# Patient Record
Sex: Male | Born: 1990 | Race: Black or African American | Hispanic: No | Smoking: Former smoker
Health system: Southern US, Community
[De-identification: ages and names within clinical notes are randomized; demographics above are authoritative.]

---

## 2016-03-28 ENCOUNTER — Emergency Department (HOSPITAL_COMMUNITY)
Admission: EM | Admit: 2016-03-28 | Discharge: 2016-03-28 | Disposition: A | Discharge status: Home / Self Care | Payer: Self-pay | Attending: Emergency Medicine | Admitting: Emergency Medicine

## 2016-03-28 ENCOUNTER — Encounter (HOSPITAL_COMMUNITY): Payer: Self-pay | Admitting: Emergency Medicine

## 2016-03-28 DIAGNOSIS — K0889 Other specified disorders of teeth and supporting structures: Secondary | ICD-10-CM | POA: Insufficient documentation

## 2016-03-28 DIAGNOSIS — F17200 Nicotine dependence, unspecified, uncomplicated: Secondary | ICD-10-CM | POA: Insufficient documentation

## 2016-03-28 MED ORDER — AMOXICILLIN 500 MG PO CAPS: 500.0000 mg | ORAL_CAPSULE | Freq: Three times a day (TID) | ORAL | 0 refills | Status: DC

## 2016-03-28 MED ORDER — IBUPROFEN 800 MG PO TABS: 800.0000 mg | ORAL_TABLET | Freq: Three times a day (TID) | ORAL | 0 refills | Status: DC

## 2016-03-28 MED ORDER — AMOXICILLIN 500 MG PO CAPS
500.0000 mg | ORAL_CAPSULE | Freq: Once | ORAL | Status: AC
  Administered 2016-03-28: 500 mg via ORAL
  Filled 2016-03-28: qty 1

## 2016-03-28 MED ORDER — HYDROCODONE-ACETAMINOPHEN 5-325 MG PO TABS: 2.0000 | ORAL_TABLET | ORAL | 0 refills | Status: DC | PRN

## 2016-03-28 MED ORDER — HYDROCODONE-ACETAMINOPHEN 5-325 MG PO TABS
1.0000 | ORAL_TABLET | Freq: Once | ORAL | Status: AC
  Administered 2016-03-28: 1 via ORAL
  Filled 2016-03-28: qty 1

## 2016-03-28 NOTE — ED Triage Notes (Signed)
Pt sts upper dental pain x 4 days

## 2016-03-28 NOTE — Discharge Instructions (Signed)
The penicillin will temporarily improved the pain in your teeth. You should see a dentist as soon as possible to discuss an extraction.

## 2016-03-28 NOTE — ED Notes (Signed)
Ice provided for the pain

## 2016-03-28 NOTE — ED Provider Notes (Signed)
[facility] Provider Note   CSN: [id number] Arrival date & time: 03/28/16  1732     History   Chief Complaint Chief Complaint  Patient presents with  . Dental Pain    HPI Prophet Gary Wilcox is a 25 y.o. male. He presents complaining of right upper tooth pain. He states it is been going on "a while". At least several weeks. He has been a piece of his right upper molar that is missing. Acutely painful 4 days ago. Feels like it is swelling in his face and he presents for evaluation. No difficult breathing or swallowing. No neck pain or swelling. No trauma.  HPI  History reviewed. No pertinent past medical history.  There are no active problems to display for this patient.   History reviewed. No pertinent surgical history.     Home Medications    Prior to Admission medications   Medication Sig Start Date End Date Taking? Authorizing Provider  amoxicillin (AMOXIL) 500 MG capsule Take 1 capsule (500 mg total) by mouth 3 (three) times daily. 03/28/16   Rolland PorterMark Jalise Zawistowski, MD  HYDROcodone-acetaminophen (NORCO/VICODIN) 5-325 MG tablet Take 2 tablets by mouth every 4 (four) hours as needed. 03/28/16   Rolland PorterMark Carely Nappier, MD  ibuprofen (ADVIL,MOTRIN) 800 MG tablet Take 1 tablet (800 mg total) by mouth 3 (three) times daily. 03/28/16   Rolland PorterMark Mineola Duan, MD    Family History History reviewed. No pertinent family history.  Social History Social History  Substance Use Topics  . Smoking status: Current Every Day Smoker  . Smokeless tobacco: Never Used  . Alcohol use No     Allergies   Patient has no known allergies.   Review of Systems Review of Systems  Constitutional: Negative for appetite change, chills, diaphoresis, fatigue and fever.  HENT: Positive for dental problem and facial swelling. Negative for mouth sores, sore throat and trouble swallowing.   Eyes: Negative for visual disturbance.  Respiratory: Negative for cough, chest tightness, shortness of breath and wheezing.     Cardiovascular: Negative for chest pain.  Gastrointestinal: Negative for abdominal distention, abdominal pain, diarrhea, nausea and vomiting.  Endocrine: Negative for polydipsia, polyphagia and polyuria.  Genitourinary: Negative for dysuria, frequency and hematuria.  Musculoskeletal: Negative for gait problem.  Skin: Negative for color change, pallor and rash.  Neurological: Negative for dizziness, syncope, light-headedness and headaches.  Hematological: Does not bruise/bleed easily.  Psychiatric/Behavioral: Negative for behavioral problems and confusion.     Physical Exam Updated Vital Signs BP 167/83 (BP Location: Left Arm)   Pulse 82   Temp 98.4 F (36.9 C) (Oral)   Resp 18   SpO2 100%   Physical Exam  Constitutional: He is oriented to person, place, and time. He appears well-developed and well-nourished. No distress.  HENT:  Head: Normocephalic.  Mouth/Throat:    Eyes: Conjunctivae are normal. Pupils are equal, round, and reactive to light. No scleral icterus.  Neck: Normal range of motion. Neck supple. No thyromegaly present.  Cardiovascular: Normal rate and regular rhythm.  Exam reveals no gallop and no friction rub.   No murmur heard. Pulmonary/Chest: Effort normal and breath sounds normal. No respiratory distress. He has no wheezes. He has no rales.  Abdominal: Soft. Bowel sounds are normal. He exhibits no distension. There is no tenderness. There is no rebound.  Musculoskeletal: Normal range of motion.  Neurological: He is alert and oriented to person, place, and time.  Skin: Skin is warm and dry. No rash noted.  Psychiatric: He has a normal mood  and affect. His behavior is normal.     ED Treatments / Results  Labs (all labs ordered are listed, but only abnormal results are displayed) Labs Reviewed - No data to display  EKG  EKG Interpretation None       Radiology No results found.  Procedures Procedures (including critical care time)  Medications  Ordered in ED Medications  amoxicillin (AMOXIL) capsule 500 mg (not administered)  HYDROcodone-acetaminophen (NORCO/VICODIN) 5-325 MG per tablet 1 tablet (not administered)     Initial Impression / Assessment and Plan / ED Course  I have reviewed the triage vital signs and the nursing notes.  Pertinent labs & imaging results that were available during my care of the patient were reviewed by me and considered in my medical decision making (see chart for details).  Clinical Course     Toe pain and dental fracture. Probable periapical infection. No obvious external abscess. No space-occupying swelling. Plan is immediately dental follow-up. Penicillin per limited number of Vicodin. Ibuprofen.  Final Clinical Impressions(s) / ED Diagnoses   Final diagnoses:  Pain, dental    New Prescriptions New Prescriptions   AMOXICILLIN (AMOXIL) 500 MG CAPSULE    Take 1 capsule (500 mg total) by mouth 3 (three) times daily.   HYDROCODONE-ACETAMINOPHEN (NORCO/VICODIN) 5-325 MG TABLET    Take 2 tablets by mouth every 4 (four) hours as needed.   IBUPROFEN (ADVIL,MOTRIN) 800 MG TABLET    Take 1 tablet (800 mg total) by mouth 3 (three) times daily.     Rolland PorterMark Jeanne Terrance, MD 03/28/16 [phone]

## 2016-09-16 ENCOUNTER — Encounter (HOSPITAL_COMMUNITY): Payer: Self-pay

## 2016-09-16 DIAGNOSIS — F17200 Nicotine dependence, unspecified, uncomplicated: Secondary | ICD-10-CM | POA: Insufficient documentation

## 2016-09-16 DIAGNOSIS — K029 Dental caries, unspecified: Secondary | ICD-10-CM | POA: Insufficient documentation

## 2016-09-16 NOTE — ED Triage Notes (Signed)
Pt here for dental pain on lower left side of face onset yesterday no relief with tylenol, asa, bc

## 2016-09-17 ENCOUNTER — Emergency Department (HOSPITAL_COMMUNITY)
Admission: EM | Admit: 2016-09-17 | Discharge: 2016-09-17 | Disposition: A | Discharge status: Home / Self Care | Payer: Self-pay | Attending: Emergency Medicine | Admitting: Emergency Medicine

## 2016-09-17 DIAGNOSIS — K0889 Other specified disorders of teeth and supporting structures: Secondary | ICD-10-CM

## 2016-09-17 MED ORDER — HYDROCODONE-ACETAMINOPHEN 5-325 MG PO TABS: 1.0000 | ORAL_TABLET | ORAL | 0 refills | Status: DC | PRN

## 2016-09-17 MED ORDER — IBUPROFEN 400 MG PO TABS
600.0000 mg | ORAL_TABLET | Freq: Once | ORAL | Status: AC
  Administered 2016-09-17: 600 mg via ORAL
  Filled 2016-09-17: qty 1

## 2016-09-17 MED ORDER — PENICILLIN V POTASSIUM 250 MG PO TABS
500.0000 mg | ORAL_TABLET | Freq: Once | ORAL | Status: AC
  Administered 2016-09-17: 500 mg via ORAL
  Filled 2016-09-17: qty 2

## 2016-09-17 MED ORDER — PENICILLIN V POTASSIUM 500 MG PO TABS: 500.0000 mg | ORAL_TABLET | Freq: Two times a day (BID) | ORAL | 0 refills | Status: AC

## 2016-09-17 MED ORDER — NAPROXEN 500 MG PO TABS: 500.0000 mg | ORAL_TABLET | Freq: Two times a day (BID) | ORAL | 0 refills | Status: DC

## 2016-09-17 MED ORDER — OXYCODONE-ACETAMINOPHEN 5-325 MG PO TABS: 1.0000 | ORAL_TABLET | Freq: Once | ORAL | Status: DC

## 2016-09-17 NOTE — Discharge Instructions (Signed)
Take medications as prescribed. You may also take 600 mg ibuprofen 4 times daily as needed for pain relief. I recommend eating prior to taking ibuprofen to prevent gastrointestinal side effects. You may apply ice to affected area for 15-20 minutes 3-4 times daily to help with pain. °Follow-up with one of the dental clinics listed below for further management of your dental pain. °Return to the emergency department if symptoms worsen or new onset of fever, headache, neck stiffness, facial/neck swelling, unable to open jaw, unable to swallow resulting in drooling, difficulty breathing, drainage, unable to tolerate fluids.  ° °[employer] °[employer] °[facility] °[address] °[place], [place] [address] °Phone [phone] ° °[facility] [employer] [facility] [place], [place], exemplifies the Dental School?s vision to improve the health and quality of life of all [place] by creating leaders with a passion to care for the underserved and by leading the nation in community-based, service learning oral health education. ° °We are committed to offering comprehensive general dental services for adults, children and special needs patients in a safe, caring and professional setting. ° ° °Appointments: Our clinic is open Monday through Friday 8:00 a.m. until 5:00 p.m. The amount of time scheduled for an appointment depends on the patient?s specific needs. We ask that you keep your appointed time for care or provide 24-hour notice of all appointment changes. Parents or legal guardians must accompany minor children. °  °Payment for Services: Medicaid and other insurance plans are welcome. Payment for services is due when services are rendered and may be made by cash or credit card. If you have dental insurance, we will assist you with your claim submission. °   °Emergencies:   Emergency services will be provided Monday through Friday on a walk-in basis.  Please arrive early for emergency services. After hours emergency services will be provided for patients of record as required. °  °Services:  °Comprehensive General Dentistry °Children?s Dentistry °Oral Surgery - Extractions °Root Canals °Sealants and Tooth Colored Fillings °Crowns and Bridges °Dentures and Partial Dentures °Implant Services °Periodontal Services and Cleanings °Cosmetic Tooth Whitening °Digital Radiography °3-D/[facility]  °

## 2016-09-17 NOTE — ED Notes (Signed)
See providers notes

## 2016-09-17 NOTE — ED Provider Notes (Signed)
[facility] Provider Note   CSN: [id number] Arrival date & time: 09/16/16  2255     History   Chief Complaint Chief Complaint  Patient presents with  . Dental Injury    HPI Gary Wilcox is a 26 y.o. male.  HPI   Patient is a 26 year old male with no pertinent past medical history presents the ED with complaint of dental pain, onset yesterday. Patient reports noticing his left lower molar was chipped yesterday morning. He reports later in the day he became to have worsening pain to his tooth which he notes with worse with eating or swallowing. Reports taking Tylenol, aspirin and BC powder at home without relief. Denies fever, facial/neck swelling, drainage, trismus, drooling, difficulty swallowing, shortness of breath, vomiting. Patient denies having a dentist.  History reviewed. No pertinent past medical history.  There are no active problems to display for this patient.   History reviewed. No pertinent surgical history.     Home Medications    Prior to Admission medications   Medication Sig Start Date End Date Taking? Authorizing Provider  amoxicillin (AMOXIL) 500 MG capsule Take 1 capsule (500 mg total) by mouth 3 (three) times daily. 03/28/16   Rolland Porter, MD  HYDROcodone-acetaminophen (NORCO/VICODIN) 5-325 MG tablet Take 2 tablets by mouth every 4 (four) hours as needed. 03/28/16   Rolland Porter, MD  ibuprofen (ADVIL,MOTRIN) 800 MG tablet Take 1 tablet (800 mg total) by mouth 3 (three) times daily. 03/28/16   Rolland Porter, MD    Family History History reviewed. No pertinent family history.  Social History Social History  Substance Use Topics  . Smoking status: Current Every Day Smoker  . Smokeless tobacco: Never Used  . Alcohol use No     Allergies   Patient has no known allergies.   Review of Systems Review of Systems  Constitutional: Negative for fever.  HENT: Positive for dental problem. Negative for facial swelling.   Respiratory: Negative for  shortness of breath.      Physical Exam Updated Vital Signs BP (!) 153/99 (BP Location: Right Arm)   Pulse 66   Temp 98.7 F (37.1 C) (Oral)   Resp 16   SpO2 100%   Physical Exam  Constitutional: He is oriented to person, place, and time. He appears well-developed and well-nourished.  HENT:  Head: Normocephalic and atraumatic.  Mouth/Throat: Uvula is midline, oropharynx is clear and moist and mucous membranes are normal. No oral lesions. No trismus in the jaw. Abnormal dentition. Dental caries present. No dental abscesses or uvula swelling. No oropharyngeal exudate, posterior oropharyngeal edema, posterior oropharyngeal erythema or tonsillar abscesses. No tonsillar exudate.    No trismus, drooling, facial/neck swelling or stridor on exam. No muffled voice. Floor of mouth soft.    Poor dentition throughout with multiple dental caries present.   Eyes: Conjunctivae and EOM are normal. Right eye exhibits no discharge. Left eye exhibits no discharge. No scleral icterus.  Neck: Normal range of motion. Neck supple.  Cardiovascular: Normal rate and intact distal pulses.   Pulmonary/Chest: Effort normal.  Musculoskeletal: Normal range of motion.  Neurological: He is alert and oriented to person, place, and time.  Skin: Skin is warm and dry.  Nursing note and vitals reviewed.    ED Treatments / Results  Labs (all labs ordered are listed, but only abnormal results are displayed) Labs Reviewed - No data to display  EKG  EKG Interpretation None       Radiology No results found.  Procedures Procedures (  including critical care time)  Medications Ordered in ED Medications  oxyCODONE-acetaminophen (PERCOCET/ROXICET) 5-325 MG per tablet 1 tablet (not administered)  penicillin v potassium (VEETID) tablet 500 mg (not administered)     Initial Impression / Assessment and Plan / ED Course  I have reviewed the triage vital signs and the nursing notes.  Pertinent labs & imaging  results that were available during my care of the patient were reviewed by me and considered in my medical decision making (see chart for details).     Patient with toothache.  No gross abscess.  Exam unconcerning for Ludwig's angina or spread of infection.  Will treat with penicillin and pain medicine.  Urged patient to follow-up with dentist.     Final Clinical Impressions(s) / ED Diagnoses   Final diagnoses:  None    New Prescriptions New Prescriptions   No medications on file     Barrett Henle, PA-C 09/17/16 0044    Zadie Rhine, MD 09/18/16 [phone]

## 2018-05-06 ENCOUNTER — Emergency Department (HOSPITAL_COMMUNITY)
Admission: EM | Admit: 2018-05-06 | Discharge: 2018-05-06 | Disposition: A | Discharge status: Home / Self Care | Payer: Self-pay | Attending: Emergency Medicine | Admitting: Emergency Medicine

## 2018-05-06 ENCOUNTER — Encounter (HOSPITAL_COMMUNITY): Payer: Self-pay | Admitting: *Deleted

## 2018-05-06 ENCOUNTER — Other Ambulatory Visit: Payer: Self-pay

## 2018-05-06 DIAGNOSIS — Z79899 Other long term (current) drug therapy: Secondary | ICD-10-CM | POA: Insufficient documentation

## 2018-05-06 DIAGNOSIS — K0889 Other specified disorders of teeth and supporting structures: Secondary | ICD-10-CM | POA: Insufficient documentation

## 2018-05-06 DIAGNOSIS — F17200 Nicotine dependence, unspecified, uncomplicated: Secondary | ICD-10-CM | POA: Insufficient documentation

## 2018-05-06 MED ORDER — PENICILLIN V POTASSIUM 250 MG PO TABS
500.0000 mg | ORAL_TABLET | Freq: Once | ORAL | Status: AC
  Administered 2018-05-06: 500 mg via ORAL
  Filled 2018-05-06: qty 2

## 2018-05-06 MED ORDER — NAPROXEN 250 MG PO TABS
500.0000 mg | ORAL_TABLET | Freq: Once | ORAL | Status: AC
  Administered 2018-05-06: 500 mg via ORAL
  Filled 2018-05-06: qty 2

## 2018-05-06 MED ORDER — PENICILLIN V POTASSIUM 500 MG PO TABS: 500.0000 mg | ORAL_TABLET | Freq: Four times a day (QID) | ORAL | 0 refills | Status: DC

## 2018-05-06 MED ORDER — NAPROXEN 500 MG PO TABS: 500.0000 mg | ORAL_TABLET | Freq: Two times a day (BID) | ORAL | 0 refills | Status: DC

## 2018-05-06 NOTE — ED Provider Notes (Signed)
[facility]   CSN: [id number] Arrival date & time: 05/06/18  [id number]     History   Chief Complaint Chief Complaint  Patient presents with  . Dental Pain    HPI Gary Wilcox is a 27 y.o. male.  The history is provided by the patient and medical records.  Dental Pain       27 year old male here with left upper dental pain.  Reports he has had a broken tooth in this area for several months now but has not had any issues until yesterday.  Reports throbbing pain in his left upper mouth that seems to radiate to his ear.  He has not had any difficulty swallowing.  Denies any fevers or chills.  No sensation of facial or neck swelling.  He is not currently established with a dentist.  He has not tried any intervention prior to arrival.  History reviewed. No pertinent past medical history.  There are no active problems to display for this patient.   History reviewed. No pertinent surgical history.      Home Medications    Prior to Admission medications   Medication Sig Start Date End Date Taking? Authorizing Provider  amoxicillin (AMOXIL) 500 MG capsule Take 1 capsule (500 mg total) by mouth 3 (three) times daily. 03/28/16   Rolland PorterJames, Mark, MD  HYDROcodone-acetaminophen (NORCO/VICODIN) 5-325 MG tablet Take 1 tablet by mouth every 4 (four) hours as needed. 09/17/16   Barrett HenleNadeau, Nicole Elizabeth, PA-C  ibuprofen (ADVIL,MOTRIN) 800 MG tablet Take 1 tablet (800 mg total) by mouth 3 (three) times daily. 03/28/16   Rolland PorterJames, Mark, MD  naproxen (NAPROSYN) 500 MG tablet Take 1 tablet (500 mg total) by mouth 2 (two) times daily. 09/17/16   Barrett HenleNadeau, Nicole Elizabeth, PA-C    Family History No family history on file.  Social History Social History   Tobacco Use  . Smoking status: Current Every Day Smoker  . Smokeless tobacco: Never Used  Substance Use Topics  . Alcohol use: No  . Drug use: No     Allergies   Patient has no known  allergies.   Review of Systems Review of Systems  HENT: Positive for dental problem.   All other systems reviewed and are negative.    Physical Exam Updated Vital Signs BP (!) 160/88   Pulse (!) 55   Resp 18   SpO2 100%   Physical Exam Vitals signs and nursing note reviewed.  Constitutional:      Appearance: He is well-developed.  HENT:     Head: Normocephalic and atraumatic.     Mouth/Throat:      Comments: Teeth largely in fair dentition, left upper first premolar broken, decayed, and hollowed out centrally, surrounding gingiva erythematous without any significant swelling; handling secretions appropriately, no trismus, no facial or neck swelling, normal phonation without stridor  Eyes:     Conjunctiva/sclera: Conjunctivae normal.     Pupils: Pupils are equal, round, and reactive to light.  Neck:     Musculoskeletal: Normal range of motion.  Cardiovascular:     Rate and Rhythm: Normal rate and regular rhythm.     Heart sounds: Normal heart sounds.  Pulmonary:     Effort: Pulmonary effort is normal.     Breath sounds: Normal breath sounds.  Abdominal:     General: Bowel sounds are normal.     Palpations: Abdomen is soft.  Musculoskeletal: Normal range of motion.  Skin:    General: Skin is  warm and dry.  Neurological:     Mental Status: He is alert and oriented to person, place, and time.      ED Treatments / Results  Labs (all labs ordered are listed, but only abnormal results are displayed) Labs Reviewed - No data to display  EKG None  Radiology No results found.  Procedures Procedures (including critical care time)  Medications Ordered in ED Medications - No data to display   Initial Impression / Assessment and Plan / ED Course  I have reviewed the triage vital signs and the nursing notes.  Pertinent labs & imaging results that were available during my care of the patient were reviewed by me and considered in my medical decision making (see  chart for details).  27 year old male here with left upper dental pain.  Has broken left upper premolar with significant decay.  There is no significant gingival swelling or signs of abscess formation.  He has no facial or neck swelling.  Handling secretions well, normal phonation without stridor.  No signs or symptoms suggestive of Ludwig's angina.  Will start on antibiotics and referred to dentist on call for follow-up.  He will return here for any new or worsening symptoms.  Final Clinical Impressions(s) / ED Diagnoses   Final diagnoses:  Pain, dental    ED Discharge Orders         Ordered    penicillin v potassium (VEETID) 500 MG tablet  4 times daily     05/06/18 0348    naproxen (NAPROSYN) 500 MG tablet  2 times daily with meals     05/06/18 0348           Garlon Hatchet, PA-C 05/06/18 0404    Derwood Kaplan, MD 05/10/18 [phone]

## 2018-05-06 NOTE — Discharge Instructions (Signed)
Take the prescribed medication as directed. Follow-up with Dr. Lucky CowboyKnox-- call in the morning for appt.  Return to the ED for new or worsening symptoms.

## 2018-05-06 NOTE — ED Triage Notes (Signed)
Pt c/o L sided toothache and neck pain for the past day. Reports chipping his tooth 6-7 months ago and hasn't had issues until tonight. Has taken OTC pain meds with some relief.

## 2020-02-02 ENCOUNTER — Ambulatory Visit (INDEPENDENT_AMBULATORY_CARE_PROVIDER_SITE_OTHER): Payer: Self-pay

## 2020-02-02 ENCOUNTER — Other Ambulatory Visit: Payer: Self-pay

## 2020-02-02 ENCOUNTER — Encounter (HOSPITAL_COMMUNITY): Payer: Self-pay | Admitting: Emergency Medicine

## 2020-02-02 ENCOUNTER — Ambulatory Visit (HOSPITAL_COMMUNITY)
Admission: EM | Admit: 2020-02-02 | Discharge: 2020-02-02 | Disposition: A | Discharge status: Home / Self Care | Payer: Self-pay | Attending: Family Medicine | Admitting: Family Medicine

## 2020-02-02 DIAGNOSIS — M25571 Pain in right ankle and joints of right foot: Secondary | ICD-10-CM

## 2020-02-02 DIAGNOSIS — S92114A Nondisplaced fracture of neck of right talus, initial encounter for closed fracture: Secondary | ICD-10-CM

## 2020-02-02 MED ORDER — IBUPROFEN 800 MG PO TABS: 800.0000 mg | ORAL_TABLET | Freq: Three times a day (TID) | ORAL | 0 refills | Status: DC

## 2020-02-02 NOTE — ED Triage Notes (Signed)
Playing in yard and stepped in a hole in the yard.  This happened yesterday.  Pain is in right foot.  Anterior ankle is where patient is pointing as location of pain.  Patient was able to ambulate from lobby

## 2020-02-02 NOTE — ED Provider Notes (Addendum)
[facility]   [id number] 02/02/20 Arrival Time: 2952  ASSESSMENT & PLAN:  1. Acute right ankle pain   2. Closed nondisplaced fracture of neck of right talus, initial encounter     I have personally viewed the imaging studies ordered this visit. Apparent talar fracture.   Meds ordered this encounter  Medications  . ibuprofen (ADVIL) 800 MG tablet    Sig: Take 1 tablet (800 mg total) by mouth 3 (three) times daily with meals.    Dispense:  21 tablet    Refill:  0    Placed in [facility] walker. Crutches given.  Recommend:  Follow-up Information    [facility].   Why: If worsening or failing to improve as anticipated. Contact information: [address] [place] [address] [phone]              Reviewed expectations re: course of current medical issues. Questions answered. Outlined signs and symptoms indicating need for more acute intervention. Patient verbalized understanding. After Visit Summary given.  SUBJECTIVE: History from: patient. Gary Wilcox is a 29 y.o. male who reports R ankle pain; stepped into hole; inversion. Able to bear weight. Injury yesterday. No extremity sensation changes or weakness. No home tx reported.    OBJECTIVE:  Vitals:   02/02/20 0909  BP: 125/65  Pulse: 62  Resp: 18  Temp: 98.5 F (36.9 C)  TempSrc: Oral  SpO2: 100%    General appearance: alert; no distress HEENT: [place]; AT Neck: supple with FROM Resp: unlabored respirations Extremities: . RLE: warm with well perfused appearance; fairly well localized moderate tenderness over right lateral ATFL ankle; without gross deformities; swelling: minimal; bruising: none; ankle ROM: normal CV: brisk extremity capillary refill of RLE; 2+ DP pulse of RLE. Skin: warm and dry; no visible rashes Neurologic: normal sensation and strength of RLE Psychological: alert and cooperative; normal mood and affect  Imaging: DG  Ankle Complete Right  Result Date: 02/02/2020 CLINICAL DATA:  Posttraumatic ankle pain on the right. EXAM: RIGHT ANKLE - COMPLETE 3+ VIEW COMPARISON:  None. FINDINGS: Tiny avulsion fracture from the dorsal talar neck.  No subluxation. IMPRESSION: Small avulsion fracture from the dorsal talar neck. Electronically Signed   By: Marnee Spring M.D.   On: 02/02/2020 09:44      No Known Allergies  History reviewed. No pertinent past medical history. Social History   Socioeconomic History  . Marital status: Single    Spouse name: Not on file  . Number of children: Not on file  . Years of education: Not on file  . Highest education level: Not on file  Occupational History  . Not on file  Tobacco Use  . Smoking status: Former Games developer  . Smokeless tobacco: Never Used  Substance and Sexual Activity  . Alcohol use: Yes  . Drug use: No  . Sexual activity: Not on file  Other Topics Concern  . Not on file  Social History Narrative  . Not on file   Social Determinants of Health   Financial Resource Strain:   . Difficulty of Paying Living Expenses: Not on file  Food Insecurity:   . Worried About Programme researcher, broadcasting/film/video in the Last Year: Not on file  . Ran Out of Food in the Last Year: Not on file  Transportation Needs:   . Lack of Transportation (Medical): Not on file  . Lack of Transportation (Non-Medical): Not on file  Physical Activity:   . Days  of Exercise per Week: Not on file  . Minutes of Exercise per Session: Not on file  Stress:   . Feeling of Stress : Not on file  Social Connections:   . Frequency of Communication with Friends and Family: Not on file  . Frequency of Social Gatherings with Friends and Family: Not on file  . Attends Religious Services: Not on file  . Active Member of Clubs or Organizations: Not on file  . Attends Banker Meetings: Not on file  . Marital Status: Not on file   History reviewed. No pertinent family history. History reviewed. No  pertinent surgical history.    Mardella Layman, MD 02/02/20 1004    Mardella Layman, MD 02/02/20 1004

## 2020-02-19 ENCOUNTER — Ambulatory Visit (INDEPENDENT_AMBULATORY_CARE_PROVIDER_SITE_OTHER): Payer: Self-pay

## 2020-02-19 ENCOUNTER — Ambulatory Visit (HOSPITAL_COMMUNITY)
Admission: EM | Admit: 2020-02-19 | Discharge: 2020-02-19 | Disposition: A | Discharge status: Home / Self Care | Payer: Self-pay | Attending: Family Medicine | Admitting: Family Medicine

## 2020-02-19 ENCOUNTER — Encounter (HOSPITAL_COMMUNITY): Payer: Self-pay

## 2020-02-19 ENCOUNTER — Other Ambulatory Visit: Payer: Self-pay

## 2020-02-19 DIAGNOSIS — S300XXA Contusion of lower back and pelvis, initial encounter: Secondary | ICD-10-CM

## 2020-02-19 DIAGNOSIS — M533 Sacrococcygeal disorders, not elsewhere classified: Secondary | ICD-10-CM

## 2020-02-19 MED ORDER — DICLOFENAC SODIUM 75 MG PO TBEC: 75.0000 mg | DELAYED_RELEASE_TABLET | Freq: Two times a day (BID) | ORAL | 0 refills | Status: DC

## 2020-02-19 MED ORDER — TRAMADOL HCL 50 MG PO TABS: 50.0000 mg | ORAL_TABLET | Freq: Four times a day (QID) | ORAL | 0 refills | Status: DC | PRN

## 2020-02-19 NOTE — ED Triage Notes (Signed)
Pt presents after falling yesterday afternoon. States he fell at work and he slipped on rocks while toting an item. States he fell on his tailbone and has had pain in the area ever since. Pt is ambulatory during intake. Reports taking aleve last night that helped some.

## 2020-02-19 NOTE — Discharge Instructions (Addendum)
Be aware, you have been prescribed pain medications that may cause drowsiness. Do not combine with alcohol or other illicit drugs. Please do not drive, operate heavy machinery, or take part in activities that require making important decisions while on this medication as your judgement may be clouded.  

## 2020-02-20 NOTE — ED Provider Notes (Signed)
[facility]   [id number] 02/19/20 Arrival Time: 1745  ASSESSMENT & PLAN:  1. Coccyx contusion, initial encounter     I have personally viewed the imaging studies ordered this visit. No fractures appreciated.  Able to ambulate here and hemodynamically stable. No indication for imaging of back at this time given no trauma and normal neurological exam. Discussed.  Begin: Meds ordered this encounter  Medications  . diclofenac (VOLTAREN) 75 MG EC tablet    Sig: Take 1 tablet (75 mg total) by mouth 2 (two) times daily.    Dispense:  14 tablet    Refill:  0  . traMADol (ULTRAM) 50 MG tablet    Sig: Take 1 tablet (50 mg total) by mouth every 6 (six) hours as needed.    Dispense:  15 tablet    Refill:  0    Medication sedation precautions given. Encourage ROM/movement as tolerated.  Recommend:  Follow-up Information    [facility].   Specialty: Urgent Care Why: If worsening or failing to improve as anticipated. Contact information: [address] [place] [address] [phone]              Reviewed expectations re: course of current medical issues. Questions answered. Outlined signs and symptoms indicating need for more acute intervention. Patient verbalized understanding. After Visit Summary given.   SUBJECTIVE: History from: patient.  Gary Wilcox is a 29 y.o. male who presents with complaint of tailbone pain s/p fall on backside yesterday afternoon. Ambulatory since. Pain worse with sitting. No extremity sensation changes or weakness. Normal bowel and bladder habits. Aleve with some relief. Normal PO intake without n/v. No associated abdominal pain.  Reports no chronic steroid use, fevers, IV drug use, or recent back surgeries or procedures.    OBJECTIVE:  Vitals:   02/19/20 1901  BP: 91/77  Pulse: 77  Resp: 19  Temp: 98.4 F (36.9 C)  SpO2: 99%    General appearance: alert; no  distress HEENT: [place]; AT Neck: supple with FROM; without midline tenderness Lungs: unlabored respirations; speaks full sentences without difficulty Abdomen: soft, non-tender; non-distended Back: moderate TTP over sacrum/coccyx; FROM at waist; bruising: none; without midline tenderness Extremities: without edema; symmetrical without gross deformities; normal ROM of bilateral LE Skin: warm and dry Neurologic: normal gait; normal sensation and strength of bilateral LE Psychological: alert and cooperative; normal mood and affect   Imaging: DG Sacrum/Coccyx  Result Date: 02/19/2020 CLINICAL DATA:  Fall.  Tailbone pain. EXAM: SACRUM AND COCCYX - 2+ VIEW COMPARISON:  None. FINDINGS: There is no evidence of fracture or other focal bone lesions. Arcuate lines of the sacrum are preserved. SI joints unremarkable. IMPRESSION: Negative. Electronically Signed   By: Kennith Center M.D.   On: 02/19/2020 19:49    No Known Allergies  History reviewed. No pertinent past medical history.   Social History   Socioeconomic History  . Marital status: Significant Other    Spouse name: Not on file  . Number of children: Not on file  . Years of education: Not on file  . Highest education level: Not on file  Occupational History  . Not on file  Tobacco Use  . Smoking status: Former Games developer  . Smokeless tobacco: Never Used  Substance and Sexual Activity  . Alcohol use: Yes  . Drug use: No  . Sexual activity: Not on file  Other Topics Concern  . Not on file  Social History Narrative  . Not on  file   Social Determinants of Health   Financial Resource Strain:   . Difficulty of Paying Living Expenses: Not on file  Food Insecurity:   . Worried About Programme researcher, broadcasting/film/video in the Last Year: Not on file  . Ran Out of Food in the Last Year: Not on file  Transportation Needs:   . Lack of Transportation (Medical): Not on file  . Lack of Transportation (Non-Medical): Not on file  Physical Activity:   . Days  of Exercise per Week: Not on file  . Minutes of Exercise per Session: Not on file  Stress:   . Feeling of Stress : Not on file  Social Connections:   . Frequency of Communication with Friends and Family: Not on file  . Frequency of Social Gatherings with Friends and Family: Not on file  . Attends Religious Services: Not on file  . Active Member of Clubs or Organizations: Not on file  . Attends Banker Meetings: Not on file  . Marital Status: Not on file  Intimate Partner Violence:   . Fear of Current or Ex-Partner: Not on file  . Emotionally Abused: Not on file  . Physically Abused: Not on file  . Sexually Abused: Not on file   History reviewed. No pertinent family history. History reviewed. No pertinent surgical history.   Mardella Layman, MD 02/20/20 [phone]

## 2020-12-27 ENCOUNTER — Encounter (HOSPITAL_COMMUNITY): Payer: Self-pay

## 2020-12-27 ENCOUNTER — Ambulatory Visit (HOSPITAL_COMMUNITY)
Admission: EM | Admit: 2020-12-27 | Discharge: 2020-12-27 | Disposition: A | Discharge status: Home / Self Care | Payer: Self-pay | Attending: Family Medicine | Admitting: Family Medicine

## 2020-12-27 ENCOUNTER — Other Ambulatory Visit: Payer: Self-pay

## 2020-12-27 DIAGNOSIS — M545 Low back pain, unspecified: Secondary | ICD-10-CM | POA: Insufficient documentation

## 2020-12-27 DIAGNOSIS — R509 Fever, unspecified: Secondary | ICD-10-CM

## 2020-12-27 DIAGNOSIS — U071 COVID-19: Secondary | ICD-10-CM | POA: Insufficient documentation

## 2020-12-27 DIAGNOSIS — R52 Pain, unspecified: Secondary | ICD-10-CM

## 2020-12-27 DIAGNOSIS — Z791 Long term (current) use of non-steroidal anti-inflammatories (NSAID): Secondary | ICD-10-CM | POA: Insufficient documentation

## 2020-12-27 LAB — POCT URINALYSIS DIPSTICK, ED / UC
Bilirubin Urine: NEGATIVE
Glucose, UA: NEGATIVE mg/dL
Hgb urine dipstick: NEGATIVE
Ketones, ur: NEGATIVE mg/dL
Leukocytes,Ua: NEGATIVE
Nitrite: NEGATIVE
Protein, ur: 30 mg/dL — AB
Specific Gravity, Urine: >=1.03 (ref 1.005–1.030)
Urobilinogen, UA: 0.2 mg/dL (ref 0.0–1.0)
pH: 5.5 (ref 5.0–8.0)

## 2020-12-27 LAB — SARS CORONAVIRUS 2 (TAT 6-24 HRS): SARS Coronavirus 2: POSITIVE — AB

## 2020-12-27 MED ORDER — ACETAMINOPHEN 325 MG PO TABS
650.0000 mg | ORAL_TABLET | Freq: Once | ORAL | Status: AC
  Administered 2020-12-27: 650 mg via ORAL

## 2020-12-27 MED ORDER — ACETAMINOPHEN 325 MG PO TABS
ORAL_TABLET | ORAL | Status: AC
  Filled 2020-12-27: qty 2

## 2020-12-27 MED ORDER — IBUPROFEN 800 MG PO TABS: 800.0000 mg | ORAL_TABLET | Freq: Three times a day (TID) | ORAL | 0 refills | Status: DC

## 2020-12-27 NOTE — ED Triage Notes (Signed)
Pt in with c/o lower back pain that started yesterday  States sometimes at night he feels sob

## 2020-12-27 NOTE — ED Provider Notes (Signed)
[facility]    CSN: [id number] Arrival date & time: 12/27/20  0941      History   Chief Complaint Chief Complaint  Patient presents with   Back Pain    HPI Gary Wilcox is a 30 y.o. male.   Patient presenting today with 1 day history of bilateral low back aching, lower abdominal aching, generalized body aches and sweats, chills, fever.  Denies cough, congestion, chest pain, shortness of breath, nausea, vomiting, bowel changes, dysuria, hematuria, known sick contacts.  Took a dose of ibuprofen last night with mild relief.  Tolerating p.o. well.  No known chronic medical problems.   History reviewed. No pertinent past medical history.  There are no problems to display for this patient.   History reviewed. No pertinent surgical history.     Home Medications    Prior to Admission medications   Medication Sig Start Date End Date Taking? Authorizing Provider  ibuprofen (ADVIL) 800 MG tablet Take 1 tablet (800 mg total) by mouth 3 (three) times daily. 12/27/20  Yes Particia Nearing, PA-C  diclofenac (VOLTAREN) 75 MG EC tablet Take 1 tablet (75 mg total) by mouth 2 (two) times daily. 02/19/20   Mardella Layman, MD  traMADol (ULTRAM) 50 MG tablet Take 1 tablet (50 mg total) by mouth every 6 (six) hours as needed. 02/19/20   Mardella Layman, MD    Family History History reviewed. No pertinent family history.  Social History Social History   Tobacco Use   Smoking status: Former   Smokeless tobacco: Never  Substance Use Topics   Alcohol use: Yes   Drug use: No     Allergies   Patient has no known allergies.   Review of Systems Review of Systems Per HPI  Physical Exam Triage Vital Signs ED Triage Vitals  Enc Vitals Group     BP 12/27/20 1147 136/90     Pulse Rate 12/27/20 1146 80     Resp 12/27/20 1146 19     Temp 12/27/20 1146 (!) 101.9 F (38.8 C)     Temp Source 12/27/20 1146 Oral     SpO2 12/27/20 1146 96 %     Weight --      Height --       Head Circumference --      Peak Flow --      Pain Score 12/27/20 1145 8     Pain Loc --      Pain Edu? --      Excl. in GC? --    No data found.  Updated Vital Signs BP 136/90 (BP Location: Right Arm)   Pulse 80   Temp (!) 101.9 F (38.8 C) (Oral)   Resp 19   SpO2 96%   Visual Acuity Right Eye Distance:   Left Eye Distance:   Bilateral Distance:    Right Eye Near:   Left Eye Near:    Bilateral Near:     Physical Exam Vitals and nursing note reviewed.  Constitutional:      Appearance: Normal appearance. He is diaphoretic.  HENT:     Head: Atraumatic.     Right Ear: Tympanic membrane normal.     Left Ear: Tympanic membrane normal.     Nose: Nose normal.     Mouth/Throat:     Mouth: Mucous membranes are moist.     Pharynx: Oropharynx is clear. No oropharyngeal exudate or posterior oropharyngeal erythema.  Eyes:     Extraocular Movements: Extraocular movements intact.  Conjunctiva/sclera: Conjunctivae normal.  Cardiovascular:     Rate and Rhythm: Normal rate and regular rhythm.     Heart sounds: Normal heart sounds.  Pulmonary:     Effort: Pulmonary effort is normal.     Breath sounds: Normal breath sounds.  Abdominal:     General: Bowel sounds are normal. There is no distension.     Palpations: Abdomen is soft.     Tenderness: There is no abdominal tenderness. There is no right CVA tenderness, left CVA tenderness or guarding.  Musculoskeletal:        General: Tenderness present. Normal range of motion.     Cervical back: Normal range of motion and neck supple.     Comments: Mild diffuse muscular tenderness to palpation across body, worse low back laterally  Skin:    General: Skin is warm.  Neurological:     General: No focal deficit present.     Mental Status: He is oriented to person, place, and time.     Motor: No weakness.     Gait: Gait normal.  Psychiatric:        Mood and Affect: Mood normal.        Thought Content: Thought content normal.         Judgment: Judgment normal.     UC Treatments / Results  Labs (all labs ordered are listed, but only abnormal results are displayed) Labs Reviewed  POCT URINALYSIS DIPSTICK, ED / UC - Abnormal; Notable for the following components:      Result Value   Protein, ur 30 (*)    All other components within normal limits  SARS CORONAVIRUS 2 (TAT 6-24 HRS)    EKG   Radiology No results found.  Procedures Procedures (including critical care time)  Medications Ordered in UC Medications  acetaminophen (TYLENOL) tablet 650 mg (650 mg Oral Given 12/27/20 1157)    Initial Impression / Assessment and Plan / UC Course  I have reviewed the triage vital signs and the nursing notes.  Pertinent labs & imaging results that were available during my care of the patient were reviewed by me and considered in my medical decision making (see chart for details).     Febrile in triage, Tylenol administered at this time.  Otherwise vital signs within normal limits, exam without significant abnormal findings.  Suspect viral cause of symptoms, UA within normal limits.  Will test for COVID, quarantine reviewed and work note given.  Continue over-the-counter pain and fever reducers, fluids, rest.  Follow-up if symptoms worsening in any time.  Final Clinical Impressions(s) / UC Diagnoses   Final diagnoses:  Acute bilateral low back pain without sciatica  Generalized body aches  Fever, unspecified   Discharge Instructions   None    ED Prescriptions     Medication Sig Dispense Auth. Provider   ibuprofen (ADVIL) 800 MG tablet Take 1 tablet (800 mg total) by mouth 3 (three) times daily. 21 tablet Particia Nearing, [place]      PDMP not reviewed this encounter.   Particia Nearing, [place] 12/27/20 1331

## 2020-12-30 ENCOUNTER — Telehealth (HOSPITAL_COMMUNITY): Payer: Self-pay | Admitting: Emergency Medicine

## 2020-12-30 NOTE — Telephone Encounter (Signed)
Received a voicemail from patient with updated phone number, updated in chart.   Called patient, verified identity and provided results, updated work note, all questions answered

## 2021-08-15 IMAGING — DX DG SACRUM/COCCYX 2+V
3 series · 3 of 3 positions shown · non-contrast
Comparison: None.

CLINICAL DATA: Fall.  Tailbone pain.

EXAM:
SACRUM AND COCCYX - 2+ VIEW

[sacrum ap]
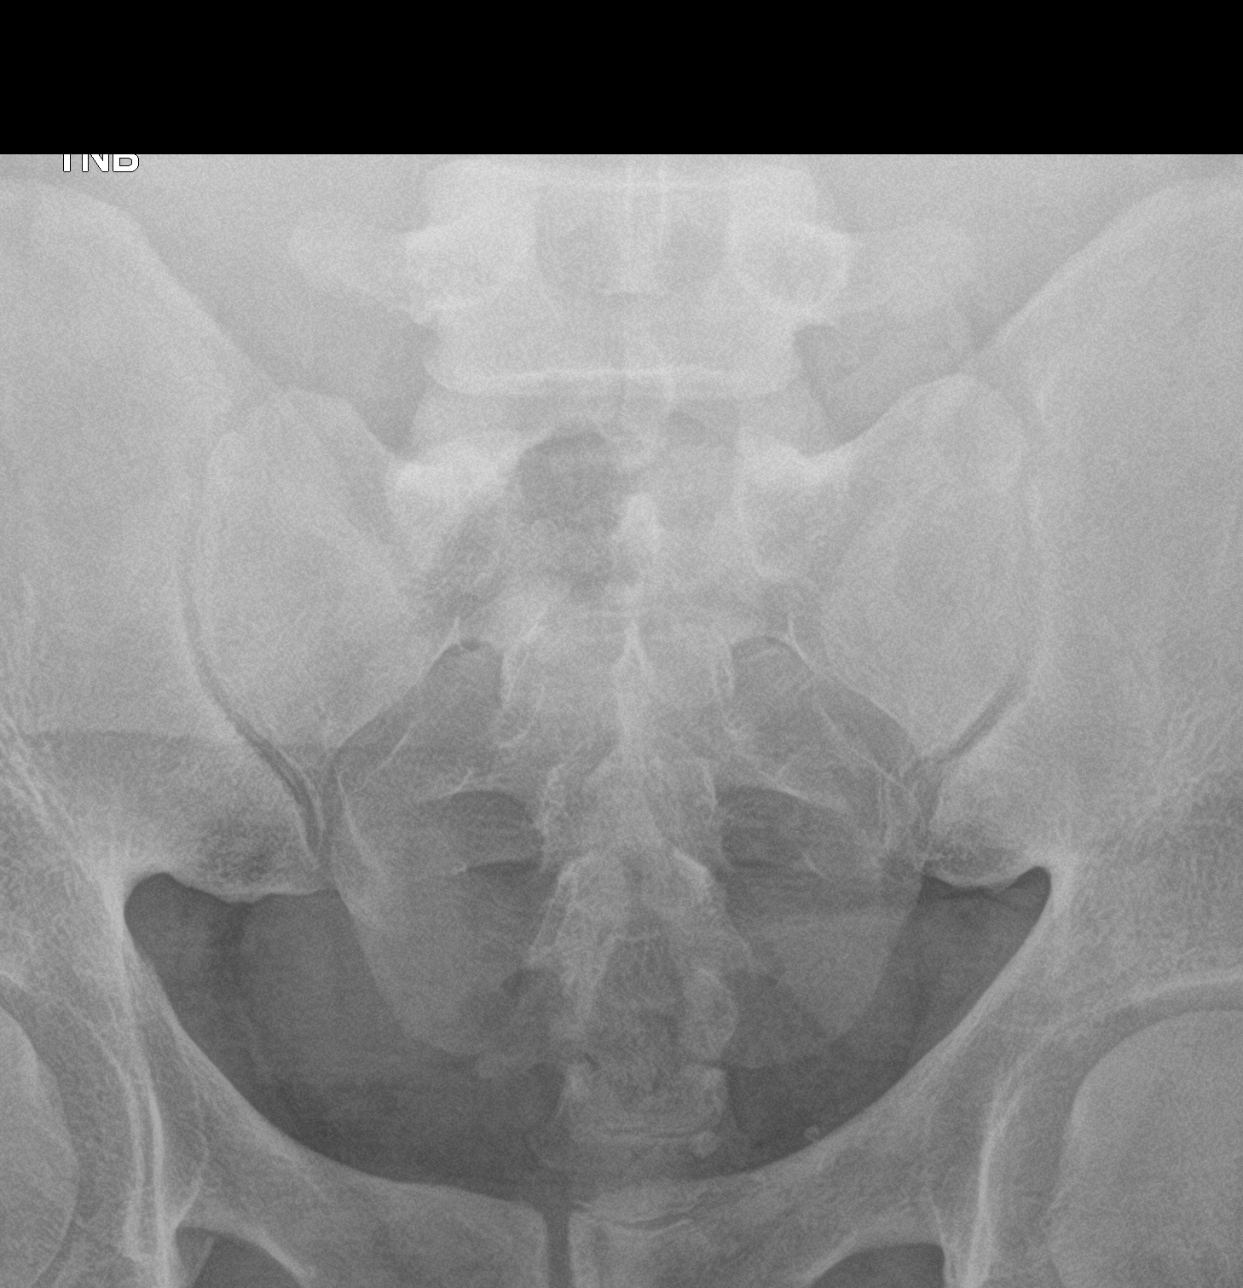

[sacrum lat]
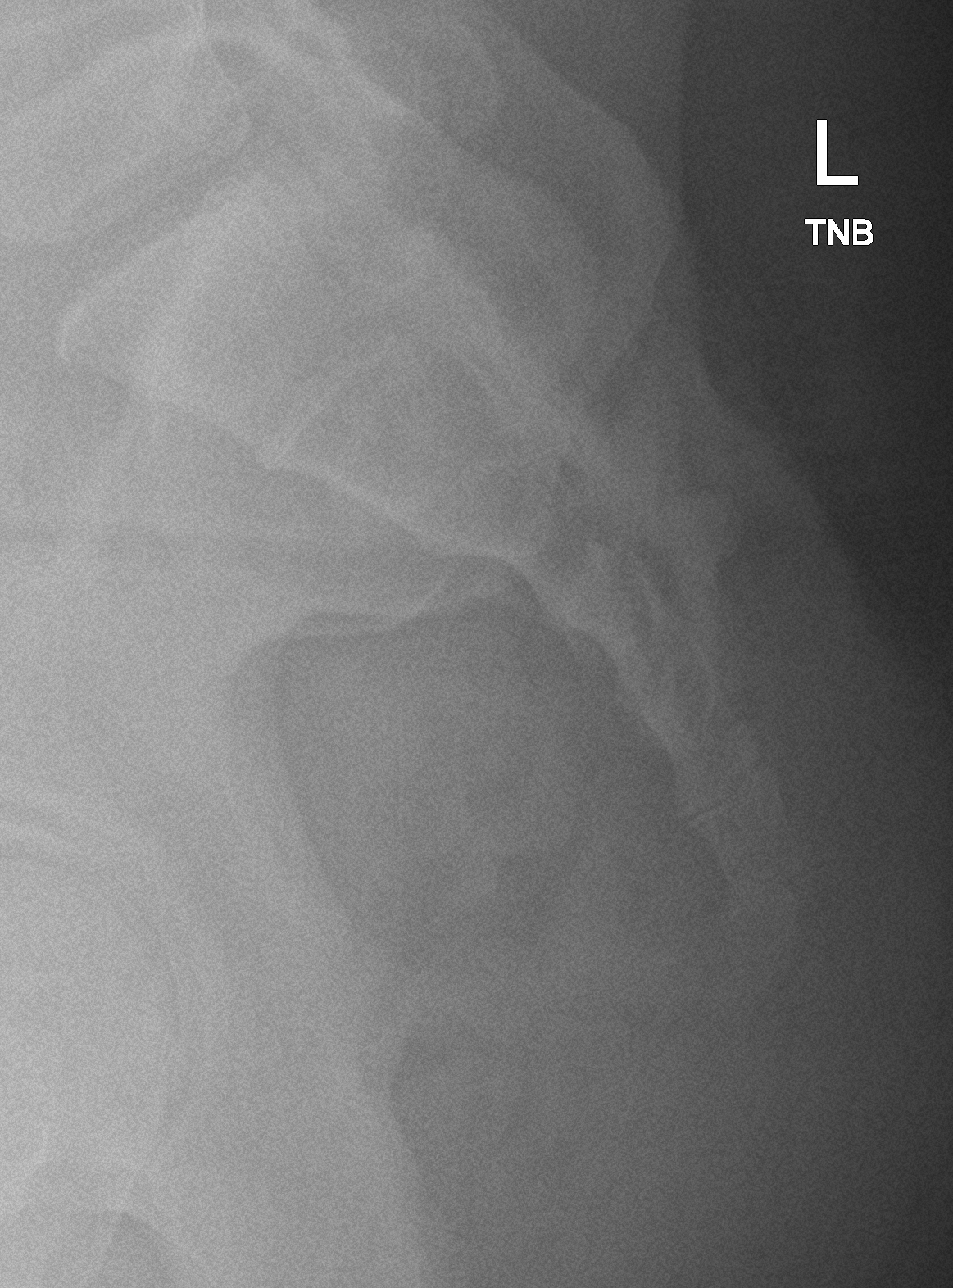

[coccyx ap]
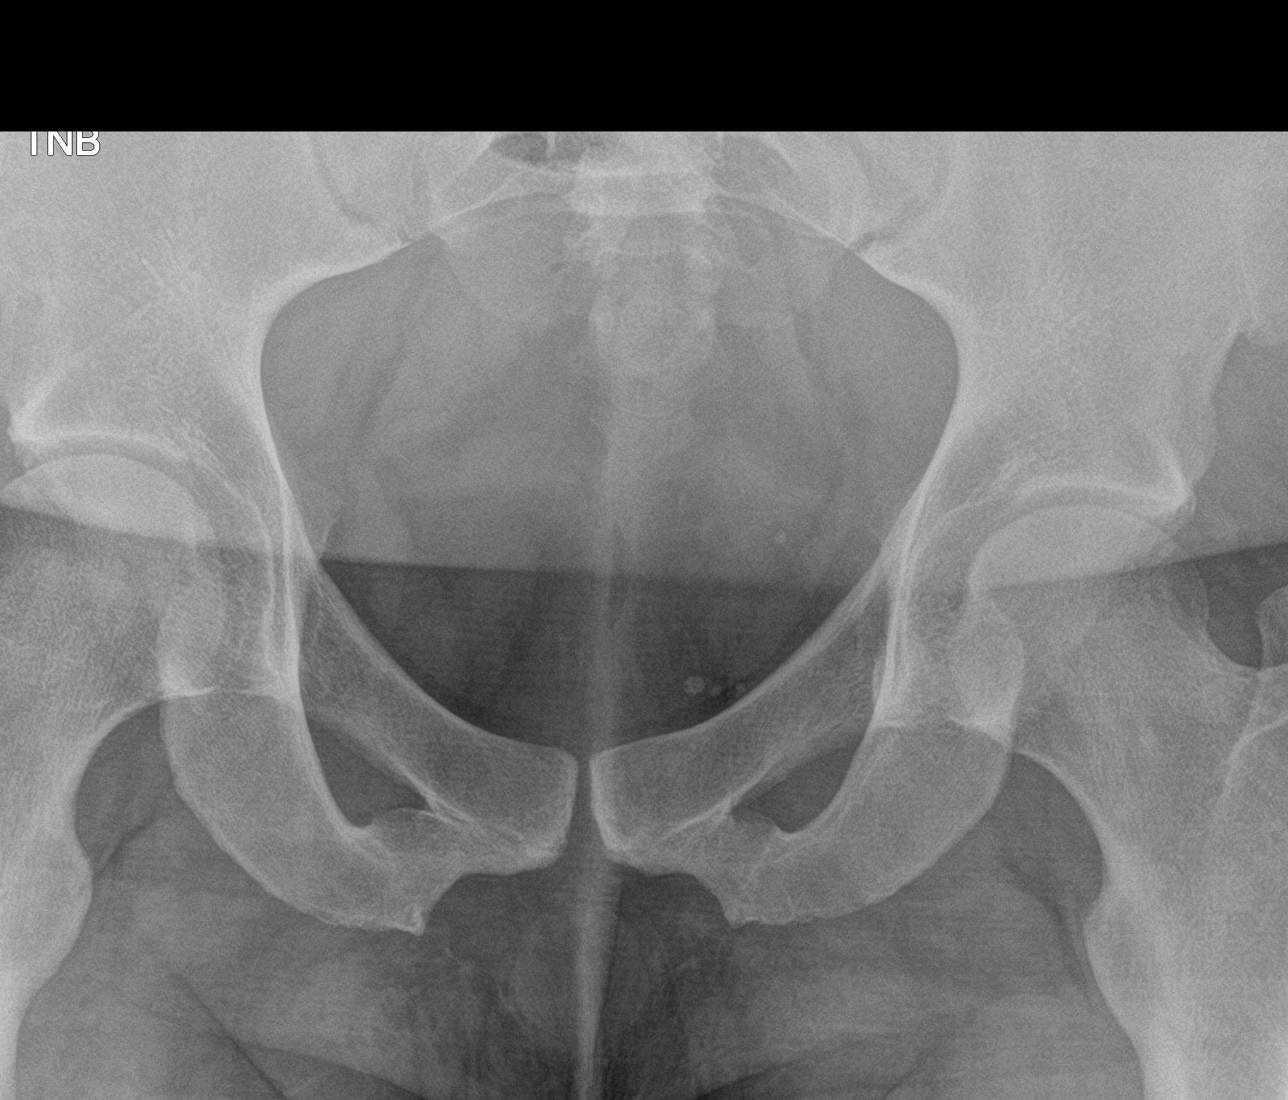

[3 of 3 positions shown; findings below may reference images not displayed]

FINDINGS: There is no evidence of fracture or other focal bone lesions.
Arcuate lines of the sacrum are preserved. SI joints unremarkable.
IMPRESSION: Negative.

## 2022-07-23 ENCOUNTER — Other Ambulatory Visit: Payer: Self-pay

## 2022-07-23 ENCOUNTER — Encounter (HOSPITAL_COMMUNITY): Payer: Self-pay | Admitting: *Deleted

## 2022-07-23 ENCOUNTER — Emergency Department (HOSPITAL_COMMUNITY)
Admission: EM | Admit: 2022-07-23 | Discharge: 2022-07-23 | Disposition: A | Discharge status: Home / Self Care | Payer: Self-pay | Attending: Emergency Medicine | Admitting: Emergency Medicine

## 2022-07-23 DIAGNOSIS — R6884 Jaw pain: Secondary | ICD-10-CM | POA: Insufficient documentation

## 2022-07-23 DIAGNOSIS — K0889 Other specified disorders of teeth and supporting structures: Secondary | ICD-10-CM

## 2022-07-23 MED ORDER — KETOROLAC TROMETHAMINE 15 MG/ML IJ SOLN
30.0000 mg | Freq: Once | INTRAMUSCULAR | Status: AC
  Administered 2022-07-23: 30 mg via INTRAMUSCULAR
  Filled 2022-07-23: qty 2

## 2022-07-23 MED ORDER — PENICILLIN V POTASSIUM 500 MG PO TABS: 500.0000 mg | ORAL_TABLET | Freq: Four times a day (QID) | ORAL | 0 refills | Status: AC

## 2022-07-23 NOTE — ED Provider Notes (Signed)
[facility] Provider Note   CSN: [id number] Arrival date & time: 07/23/22  0230     History  Chief Complaint  Patient presents with   Dental Pain    Gary Wilcox is a 32 y.o. male.  HPI   Patient without significant medical history presented with complaints of right lower dental pain.  He  has been having issues with this tooth for last few months but over the last few hours pain has gotten a lot worse, states she feels pain just under his right jaw, states he had occasional chills, but no actual fever, denies any tongue throat lip swelling difficulty breathing.  States that he has not seen dentist in a long time.  He is not immunocompromise, has no other complaints.  Home Medications Prior to Admission medications   Medication Sig Start Date End Date Taking? Authorizing Provider  penicillin v potassium (VEETID) 500 MG tablet Take 1 tablet (500 mg total) by mouth 4 (four) times daily for 7 days. 07/23/22 07/30/22 Yes Marcello Fennel, PA-C  diclofenac (VOLTAREN) 75 MG EC tablet Take 1 tablet (75 mg total) by mouth 2 (two) times daily. 02/19/20   Vanessa Kick, MD  ibuprofen (ADVIL) 800 MG tablet Take 1 tablet (800 mg total) by mouth 3 (three) times daily. 12/27/20   Volney American, PA-C  traMADol (ULTRAM) 50 MG tablet Take 1 tablet (50 mg total) by mouth every 6 (six) hours as needed. 02/19/20   Vanessa Kick, MD      Allergies    Patient has no known allergies.    Review of Systems   Review of Systems  Constitutional:  Negative for chills and fever.  HENT:  Positive for dental problem.   Respiratory:  Negative for shortness of breath.   Cardiovascular:  Negative for chest pain.  Gastrointestinal:  Negative for abdominal pain.  Neurological:  Negative for headaches.    Physical Exam Updated Vital Signs BP (!) 175/117 (BP Location: Right Arm)   Pulse (!) 58   Temp 98.3 F (36.8 C) (Oral)   Resp 20   Ht '5\' 7"'$  (1.702  m)   Wt 113.4 kg   SpO2 99%   BMI 39.16 kg/m  Physical Exam Vitals and nursing note reviewed.  Constitutional:      General: He is not in acute distress.    Appearance: He is not ill-appearing.  HENT:     Head: Normocephalic and atraumatic.     Nose: No congestion.     Mouth/Throat:     Mouth: Mucous membranes are moist.     Pharynx: Oropharynx is clear. No oropharyngeal exudate or posterior oropharyngeal erythema.     Comments: No facial swelling no overlying erythema, no trismus no torticollis, tongue uvula both midline controlling oral secretions tonsils both equal symmetric bilaterally, no submandibular swelling, no muffled tone voice.  Patient has significant dental decay of the right back molar, no evidence of gingivitis, no palpable fluctuance noted. Eyes:     Extraocular Movements: Extraocular movements intact.     Conjunctiva/sclera: Conjunctivae normal.  Cardiovascular:     Rate and Rhythm: Normal rate and regular rhythm.     Heart sounds:     No friction rub.  Pulmonary:     Effort: Pulmonary effort is normal.  Skin:    General: Skin is warm and dry.  Neurological:     Mental Status: He is alert.  Psychiatric:  Mood and Affect: Mood normal.     ED Results / Procedures / Treatments   Labs (all labs ordered are listed, but only abnormal results are displayed) Labs Reviewed - No data to display  EKG None  Radiology No results found.  Procedures Procedures    Medications Ordered in ED Medications  ketorolac (TORADOL) 15 MG/ML injection 30 mg (30 mg Intramuscular Given 07/23/22 0434)    ED Course/ Medical Decision Making/ A&P                             Medical Decision Making  This patient presents to the ED for concern of dental pain, this involves an extensive number of treatment options, and is a complaint that carries with it a high risk of complications and morbidity.  The differential diagnosis includes Ludwig angina,  retropharyngeal/peritonsillar abscess, periorbital cellulitis    Additional history obtained:  Additional history obtained from N/A External records from outside source obtained and reviewed including ER notes   Co morbidities that complicate the patient evaluation  N/A  Social Determinants of Health:  Does not have a dentist    Lab Tests:  I Ordered, and personally interpreted labs.  The pertinent results include: N/A   Imaging Studies ordered:  I ordered imaging studies including N/A I independently visualized and interpreted imaging which showed N/A I agree with the radiologist interpretation   Cardiac Monitoring:  The patient was maintained on a cardiac monitor.  I personally viewed and interpreted the cardiac monitored which showed an underlying rhythm of: N/A   Medicines ordered and prescription drug management:  I ordered medication including Toradol I have reviewed the patients home medicines and have made adjustments as needed  Critical Interventions:  N/A   Reevaluation:  Presents with dental pain, benign physical exam, agreement discharge at this time.  Consultations Obtained:  N/A    Test Considered:  N/A    Rule out I have low suspicion for peritonsillar abscess, retropharyngeal abscess, or Ludwig angina as oropharynx was visualized tongue and uvula were both midline, there is no exudates, erythema or edema noted in the posterior pillars or on/ around tonsils.  Low suspicion for an abscess as gumline were palpated no fluctuance or induration felt.  Low suspicion for periorbital or orbital cellulitis as patient face had no erythematous, patient EOMs were intact, he had no pain with eye movement.    Dispostion and problem list  After consideration of the diagnostic results and the patients response to treatment, I feel that the patent would benefit from discharge.  Dental pain-likely secondary to a cavity, will start on antibiotics,  refer him to a dentist for further evaluation and strict return precautions.            Final Clinical Impression(s) / ED Diagnoses Final diagnoses:  Pain, dental    Rx / DC Orders ED Discharge Orders          Ordered    penicillin v potassium (VEETID) 500 MG tablet  4 times daily        07/23/22 0437              Marcello Fennel, PA-C 07/23/22 [id number]    Merryl Hacker, MD 07/23/22 [phone]

## 2022-07-23 NOTE — ED Triage Notes (Signed)
The pt is c/o a toothache today he has taken ibuprofen

## 2022-07-23 NOTE — Discharge Instructions (Signed)
Likely you have a dental cavity, started on antibiotics please take as prescribed.  I recommend over-the-counter pain medication as needed.  Please follow-up with a dentist for further evaluation.  Come back to the emergency department if you develop chest pain, shortness of breath, severe abdominal pain, uncontrolled nausea, vomiting, diarrhea.

## 2023-11-04 ENCOUNTER — Encounter (HOSPITAL_COMMUNITY): Payer: Self-pay

## 2023-11-04 ENCOUNTER — Emergency Department (HOSPITAL_COMMUNITY)
Admission: EM | Admit: 2023-11-04 | Discharge: 2023-11-04 | Disposition: A | Discharge status: Home / Self Care | Payer: Self-pay | Attending: Emergency Medicine | Admitting: Emergency Medicine

## 2023-11-04 ENCOUNTER — Other Ambulatory Visit: Payer: Self-pay

## 2023-11-04 DIAGNOSIS — K0889 Other specified disorders of teeth and supporting structures: Secondary | ICD-10-CM | POA: Insufficient documentation

## 2023-11-04 MED ORDER — KETOROLAC TROMETHAMINE 15 MG/ML IJ SOLN
15.0000 mg | Freq: Once | INTRAMUSCULAR | Status: DC
  Filled 2023-11-04: qty 1

## 2023-11-04 MED ORDER — ACETAMINOPHEN 500 MG PO TABS
1000.0000 mg | ORAL_TABLET | Freq: Once | ORAL | Status: AC
  Administered 2023-11-04: 1000 mg via ORAL
  Filled 2023-11-04: qty 2

## 2023-11-04 MED ORDER — AMOXICILLIN 500 MG PO CAPS: 1000.0000 mg | ORAL_CAPSULE | Freq: Two times a day (BID) | ORAL | 0 refills | Status: DC

## 2023-11-04 MED ORDER — AMOXICILLIN 500 MG PO CAPS
1000.0000 mg | ORAL_CAPSULE | Freq: Once | ORAL | Status: AC
  Administered 2023-11-04: 1000 mg via ORAL
  Filled 2023-11-04: qty 2

## 2023-11-04 MED ORDER — OXYCODONE HCL 5 MG PO TABS
5.0000 mg | ORAL_TABLET | Freq: Once | ORAL | Status: AC
  Administered 2023-11-04: 5 mg via ORAL
  Filled 2023-11-04: qty 1

## 2023-11-04 NOTE — ED Triage Notes (Signed)
 Pt reports right lower toothache x 2 hours. Pt states that he has had similar issues with same tooth in past.

## 2023-11-04 NOTE — Discharge Instructions (Signed)
 Follow up with a dentist in the office.   Please return for rapid swelling fever for inability to swallow.  Please call the dentist to try and set up an appointment.

## 2023-11-04 NOTE — ED Provider Notes (Signed)
 [place] EMERGENCY DEPARTMENT AT [facility] Provider Note   CSN: [id number] Arrival date & time: 11/04/23  2009     Patient presents with: Dental Pain   Gary Wilcox is a 33 y.o. male.   33 yo M with a chief complaint of right lower dental pain.  Patient has a history of the same.  Tells me that the tooth is partially broken.  He has some other broken teeth in his mouth but none of the others are bothering him.  This ones been bothering him for couple hours now.  No fevers no swelling no difficulty breathing or swallowing.   Dental Pain      Prior to Admission medications   Medication Sig Start Date End Date Taking? Authorizing Provider  amoxicillin  (AMOXIL ) 500 MG capsule Take 2 capsules (1,000 mg total) by mouth 2 (two) times daily. 11/04/23  Yes Albertus Hughs, DO  diclofenac  (VOLTAREN ) 75 MG EC tablet Take 1 tablet (75 mg total) by mouth 2 (two) times daily. 02/19/20   Afton Albright, MD  ibuprofen  (ADVIL ) 800 MG tablet Take 1 tablet (800 mg total) by mouth 3 (three) times daily. 12/27/20   Corbin Dess, PA-C  traMADol  (ULTRAM ) 50 MG tablet Take 1 tablet (50 mg total) by mouth every 6 (six) hours as needed. 02/19/20   Afton Albright, MD    Allergies: Patient has no known allergies.    Review of Systems  Updated Vital Signs BP (!) 146/90 (BP Location: Right Arm)   Pulse 63   Temp 98.6 F (37 C)   Resp 15   SpO2 99%   Physical Exam Vitals and nursing note reviewed.  Constitutional:      Appearance: He is well-developed.  HENT:     Head: Normocephalic and atraumatic.     Mouth/Throat:     Comments: Poor dentition diffusely.  Multiple fractured teeth to the right lower aspect of the jaw.  The tooth he is concerned about is his right lower first molar.  There is no surrounding edema no sublingual swelling tolerating secretions without difficulty.  Eyes:     Pupils: Pupils are equal, round, and reactive to light.   Neck:     Vascular: No JVD.    Cardiovascular:     Rate and Rhythm: Normal rate and regular rhythm.     Heart sounds: No murmur heard.    No friction rub. No gallop.  Pulmonary:     Effort: No respiratory distress.     Breath sounds: No wheezing.  Abdominal:     General: There is no distension.     Tenderness: There is no abdominal tenderness. There is no guarding or rebound.   Musculoskeletal:        General: Normal range of motion.     Cervical back: Normal range of motion and neck supple.   Skin:    Coloration: Skin is not pale.     Findings: No rash.   Neurological:     Mental Status: He is alert and oriented to person, place, and time.   Psychiatric:        Behavior: Behavior normal.     (all labs ordered are listed, but only abnormal results are displayed) Labs Reviewed - No data to display  EKG: None  Radiology: No results found.   Procedures   Medications Ordered in the ED  acetaminophen  (TYLENOL ) tablet 1,000 mg (has no administration in time range)  ketorolac  (TORADOL ) 15 MG/ML injection 15 mg (has  no administration in time range)  oxyCODONE  (Oxy IR/ROXICODONE ) immediate release tablet 5 mg (has no administration in time range)  amoxicillin  (AMOXIL ) capsule 1,000 mg (has no administration in time range)                                    Medical Decision Making Risk OTC drugs. Prescription drug management.   33 yo M with a chief complaints of right lower dental pain.  No obvious concerning findings on exam.  Will start on amoxicillin  for possible infection.  Will have him follow-up with a dentist in the office.  9:53 PM:  I have discussed the diagnosis/risks/treatment options with the patient and family.  Evaluation and diagnostic testing in the emergency department does not suggest an emergent condition requiring admission or immediate intervention beyond what has been performed at this time.  They will follow up with Dentistry. We also discussed returning to the ED  immediately if new or worsening sx occur. We discussed the sx which are most concerning (e.g., sudden worsening pain, fever, inability to tolerate by mouth) that necessitate immediate return. Medications administered to the patient during their visit and any new prescriptions provided to the patient are listed below.  Medications given during this visit Medications  acetaminophen  (TYLENOL ) tablet 1,000 mg (has no administration in time range)  ketorolac  (TORADOL ) 15 MG/ML injection 15 mg (has no administration in time range)  oxyCODONE  (Oxy IR/ROXICODONE ) immediate release tablet 5 mg (has no administration in time range)  amoxicillin  (AMOXIL ) capsule 1,000 mg (has no administration in time range)     The patient appears reasonably screen and/or stabilized for discharge and I doubt any other medical condition or other [facility] requiring further screening, evaluation, or treatment in the ED at this time prior to discharge.       Final diagnoses:  Pain, dental    ED Discharge Orders          Ordered    amoxicillin  (AMOXIL ) 500 MG capsule  2 times daily        11/04/23 2148               Albertus Hughs, DO 11/04/23 2153

## 2024-01-30 ENCOUNTER — Ambulatory Visit (INDEPENDENT_AMBULATORY_CARE_PROVIDER_SITE_OTHER): Payer: Self-pay

## 2024-01-30 ENCOUNTER — Encounter (HOSPITAL_COMMUNITY): Payer: Self-pay

## 2024-01-30 ENCOUNTER — Ambulatory Visit (HOSPITAL_COMMUNITY)
Admission: EM | Admit: 2024-01-30 | Discharge: 2024-01-30 | Disposition: A | Discharge status: Home / Self Care | Payer: Self-pay | Attending: Family Medicine | Admitting: Family Medicine

## 2024-01-30 DIAGNOSIS — M546 Pain in thoracic spine: Secondary | ICD-10-CM

## 2024-01-30 DIAGNOSIS — M542 Cervicalgia: Secondary | ICD-10-CM

## 2024-01-30 MED ORDER — CYCLOBENZAPRINE HCL 10 MG PO TABS: 10.0000 mg | ORAL_TABLET | Freq: Two times a day (BID) | ORAL | 0 refills | Status: AC | PRN

## 2024-01-30 MED ORDER — IBUPROFEN 800 MG PO TABS: 800.0000 mg | ORAL_TABLET | Freq: Three times a day (TID) | ORAL | 0 refills | Status: AC

## 2024-01-30 NOTE — ED Triage Notes (Signed)
 Patient presents to the office for right side neck and back pain that started 2 days ago. Denies any recent injuries or trauma.

## 2024-01-30 NOTE — Discharge Instructions (Signed)
 You were seen today for back/neck pain.  Your xray appears normal. If the radiologist reads this differently we will notify you.  I have sent out an anti-inflammatory and muscle relaxer for pain.  This may make you tired/drowsy so please take when home and not driving.  You may use a heating pad for pain.  If not improving or worsening then please return or follow up with your primary care provider.

## 2024-01-30 NOTE — ED Provider Notes (Addendum)
 [facility]    CSN: [id number] Arrival date & time: 01/30/24  1122      History   Chief Complaint Chief Complaint  Patient presents with   Back Pain   Neck Pain    HPI Gary Wilcox is a 33 y.o. male.    Back Pain Neck Pain  Patient is here for neck/back pain that started 2 days ago.  No known injury.  He just woke up with pain.  No URI symptoms.  No fevers/chills.  No n/v.  He took a muscle relaxer around 2am with some help.  He did wake this morning and the pain was bad.  He had very limited rom this am, but that has improved.        History reviewed. No pertinent past medical history.  There are no active problems to display for this patient.   History reviewed. No pertinent surgical history.     Home Medications    Prior to Admission medications   Medication Sig Start Date End Date Taking? Authorizing Provider  amoxicillin  (AMOXIL ) 500 MG capsule Take 2 capsules (1,000 mg total) by mouth 2 (two) times daily. 11/04/23   Emil Share, DO  diclofenac  (VOLTAREN ) 75 MG EC tablet Take 1 tablet (75 mg total) by mouth 2 (two) times daily. 02/19/20   Rolinda Rogue, MD  ibuprofen  (ADVIL ) 800 MG tablet Take 1 tablet (800 mg total) by mouth 3 (three) times daily. 12/27/20   Stuart Vernell Norris, PA-C  traMADol  (ULTRAM ) 50 MG tablet Take 1 tablet (50 mg total) by mouth every 6 (six) hours as needed. 02/19/20   Rolinda Rogue, MD    Family History History reviewed. No pertinent family history.  Social History Social History   Tobacco Use   Smoking status: Former   Smokeless tobacco: Never  Substance Use Topics   Alcohol use: Yes   Drug use: No     Allergies   Patient has no known allergies.   Review of Systems Review of Systems  Constitutional: Negative.   HENT: Negative.    Respiratory: Negative.    Cardiovascular: Negative.   Gastrointestinal: Negative.   Genitourinary: Negative.   Musculoskeletal:  Positive for back pain and neck  pain.  Psychiatric/Behavioral: Negative.       Physical Exam Triage Vital Signs ED Triage Vitals  Encounter Vitals Group     BP 01/30/24 1211 123/88     Girls Systolic BP Percentile --      Girls Diastolic BP Percentile --      Boys Systolic BP Percentile --      Boys Diastolic BP Percentile --      Pulse Rate 01/30/24 1211 72     Resp 01/30/24 1211 18     Temp 01/30/24 1211 97.8 F (36.6 C)     Temp Source 01/30/24 1211 Oral     SpO2 01/30/24 1211 96 %     Weight --      Height --      Head Circumference --      Peak Flow --      Pain Score 01/30/24 1214 6     Pain Loc --      Pain Education --      Exclude from Growth Chart --    No data found.  Updated Vital Signs BP 123/88 (BP Location: Left Arm)   Pulse 72   Temp 97.8 F (36.6 C) (Oral)   Resp 18   SpO2 96%   Visual  Acuity Right Eye Distance:   Left Eye Distance:   Bilateral Distance:    Right Eye Near:   Left Eye Near:    Bilateral Near:     Physical Exam Constitutional:      General: He is not in acute distress.    Appearance: Normal appearance. He is normal weight. He is not ill-appearing or toxic-appearing.  Cardiovascular:     Rate and Rhythm: Normal rate and regular rhythm.  Pulmonary:     Effort: Pulmonary effort is normal.     Breath sounds: Normal breath sounds.  Musculoskeletal:     Comments: Mild spine tenderness between the shoulder blade;  +TTP to the paraspinals at this site;  full rom of the neck with pain with full flexion/extension;  full rom of the bilateral shoulders with minimal pain to the back  Skin:    General: Skin is warm.  Neurological:     General: No focal deficit present.     Mental Status: He is alert.  Psychiatric:        Mood and Affect: Mood normal.      UC Treatments / Results  Labs (all labs ordered are listed, but only abnormal results are displayed) Labs Reviewed - No data to display  EKG   Radiology DG Cervical Spine Complete Result Date:  01/30/2024 CLINICAL DATA:  Right-sided neck pain for 2 days without known injury. EXAM: CERVICAL SPINE - COMPLETE 4+ VIEW COMPARISON:  None Available. FINDINGS: There is no evidence of cervical spine fracture or prevertebral soft tissue swelling. Alignment is normal. No other significant bone abnormalities are identified. IMPRESSION: Negative cervical spine radiographs. Electronically Signed   By: Lynwood Landy Raddle M.D.   On: 01/30/2024 13:00    Procedures Procedures (including critical care time)  Medications Ordered in UC Medications - No data to display  Initial Impression / Assessment and Plan / UC Course  I have reviewed the triage vital signs and the nursing notes.  Pertinent labs & imaging results that were available during my care of the patient were reviewed by me and considered in my medical decision making (see chart for details).   Final Clinical Impressions(s) / UC Diagnoses   Final diagnoses:  Neck pain  Acute midline thoracic back pain     Discharge Instructions      You were seen today for back/neck pain.  Your xray appears normal. If the radiologist reads this differently we will notify you.  I have sent out an anti-inflammatory and muscle relaxer for pain.  This may make you tired/drowsy so please take when home and not driving.  You may use a heating pad for pain.  If not improving or worsening then please return or follow up with your primary care provider.     ED Prescriptions     Medication Sig Dispense Auth. Provider   ibuprofen  (ADVIL ) 800 MG tablet Take 1 tablet (800 mg total) by mouth 3 (three) times daily. 21 tablet Azaryah Heathcock, MD   cyclobenzaprine  (FLEXERIL ) 10 MG tablet Take 1 tablet (10 mg total) by mouth 2 (two) times daily as needed for muscle spasms. 20 tablet Darral Longs, MD      PDMP not reviewed this encounter.   Darral Longs, MD 01/30/24 1259    Darral Longs, MD 01/30/24 1310
